# Patient Record
Sex: Male | Born: 1985 | Race: White | Hispanic: No | Marital: Single | Smoking: Never smoker
Health system: Southern US, Community
[De-identification: ages and names within clinical notes are randomized; demographics above are authoritative.]

## PROBLEM LIST (undated history)

## (undated) DIAGNOSIS — T7840XA Allergy, unspecified, initial encounter: Secondary | ICD-10-CM

## (undated) HISTORY — DX: Allergy, unspecified, initial encounter: T78.40XA

---
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
## 2005-10-26 ENCOUNTER — Encounter: Payer: Self-pay | Admitting: Family Medicine

## 2006-07-18 ENCOUNTER — Ambulatory Visit: Payer: Self-pay | Admitting: Family Medicine

## 2006-07-18 DIAGNOSIS — G4700 Insomnia, unspecified: Secondary | ICD-10-CM

## 2006-07-18 DIAGNOSIS — J45901 Unspecified asthma with (acute) exacerbation: Secondary | ICD-10-CM | POA: Insufficient documentation

## 2006-07-21 ENCOUNTER — Encounter: Payer: Self-pay | Admitting: Family Medicine

## 2006-09-02 ENCOUNTER — Telehealth: Payer: Self-pay | Admitting: Family Medicine

## 2006-11-15 ENCOUNTER — Ambulatory Visit: Payer: Self-pay | Admitting: Family Medicine

## 2006-11-15 ENCOUNTER — Encounter: Admission: RE | Admit: 2006-11-15 | Discharge: 2006-11-15 | Payer: Self-pay | Admitting: Family Medicine

## 2006-12-29 ENCOUNTER — Telehealth: Payer: Self-pay | Admitting: Family Medicine

## 2008-06-18 IMAGING — CR DG FOOT 2V*L*
2 series · 2 of 2 positions shown · non-contrast
Comparison: none

CLINICAL DATA: Heel pain.  Hurt playing football.  Swelling and bruising. 
 LEFT FOOT ? 2 VIEW:

[view not recorded (1 of 2)]
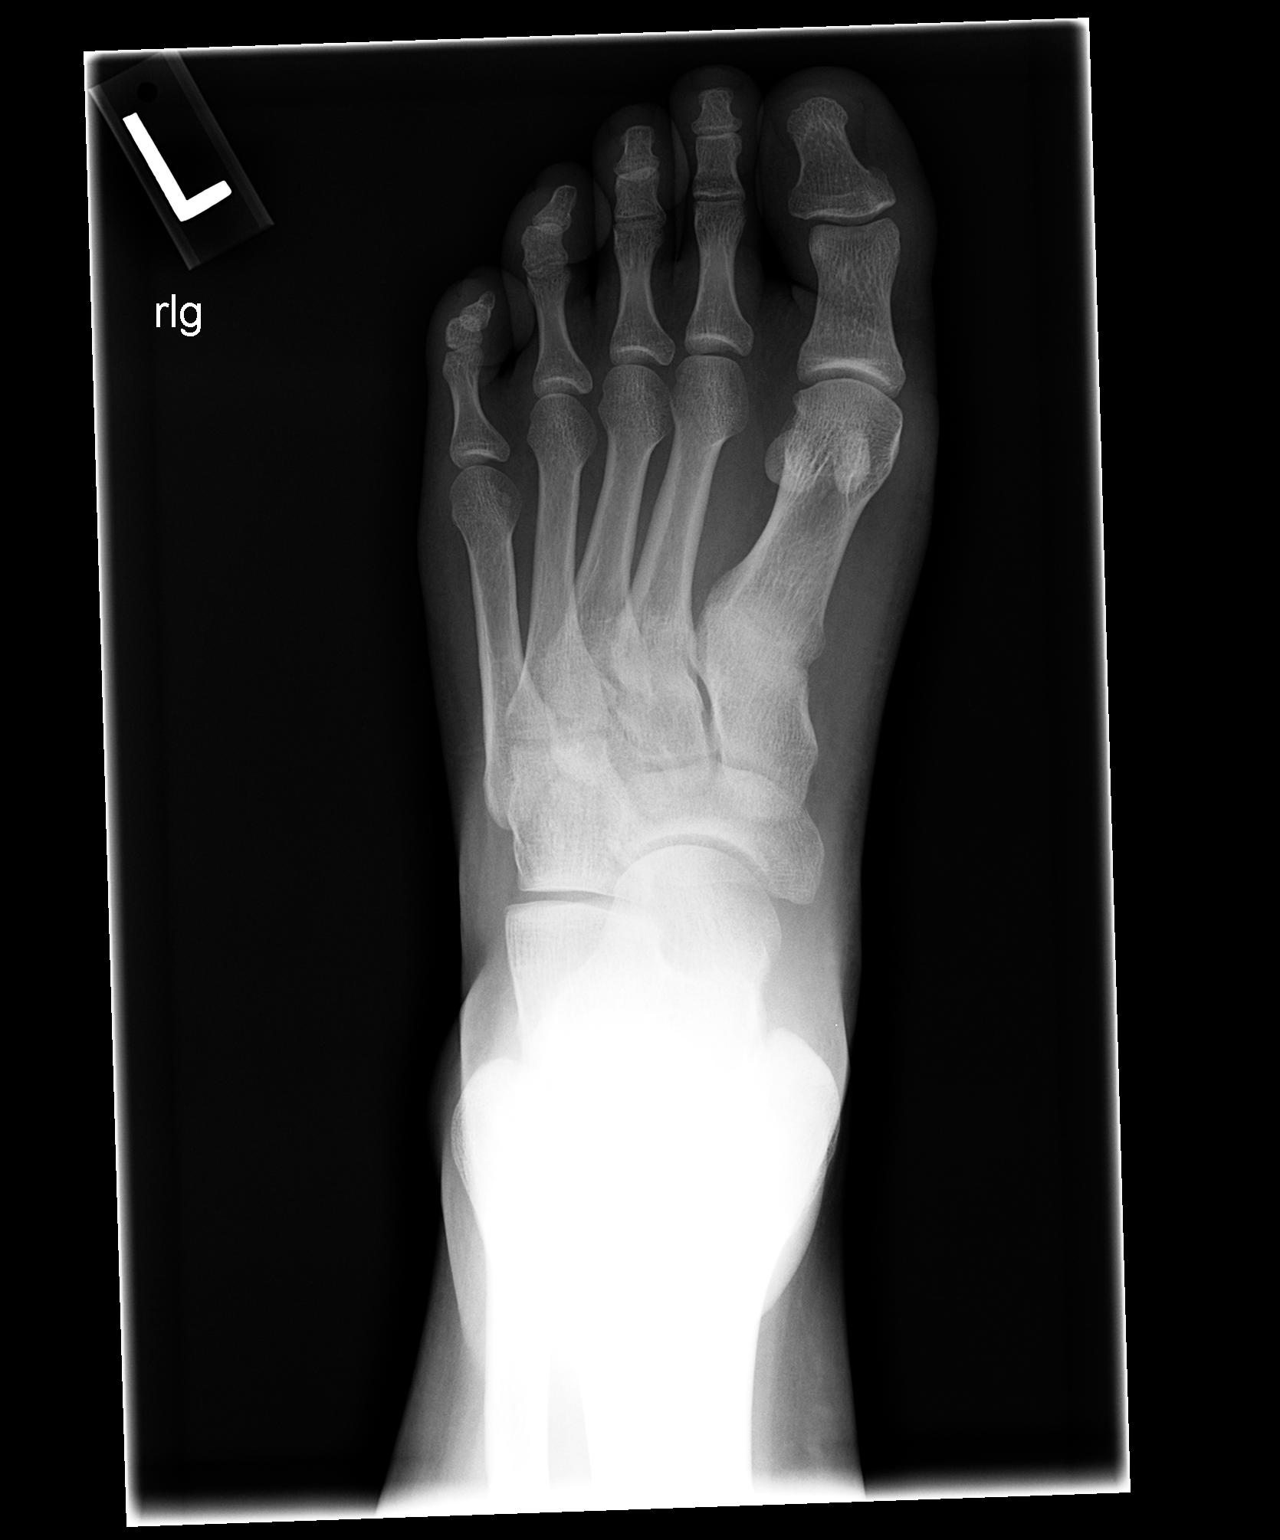

[view not recorded (2 of 2)]
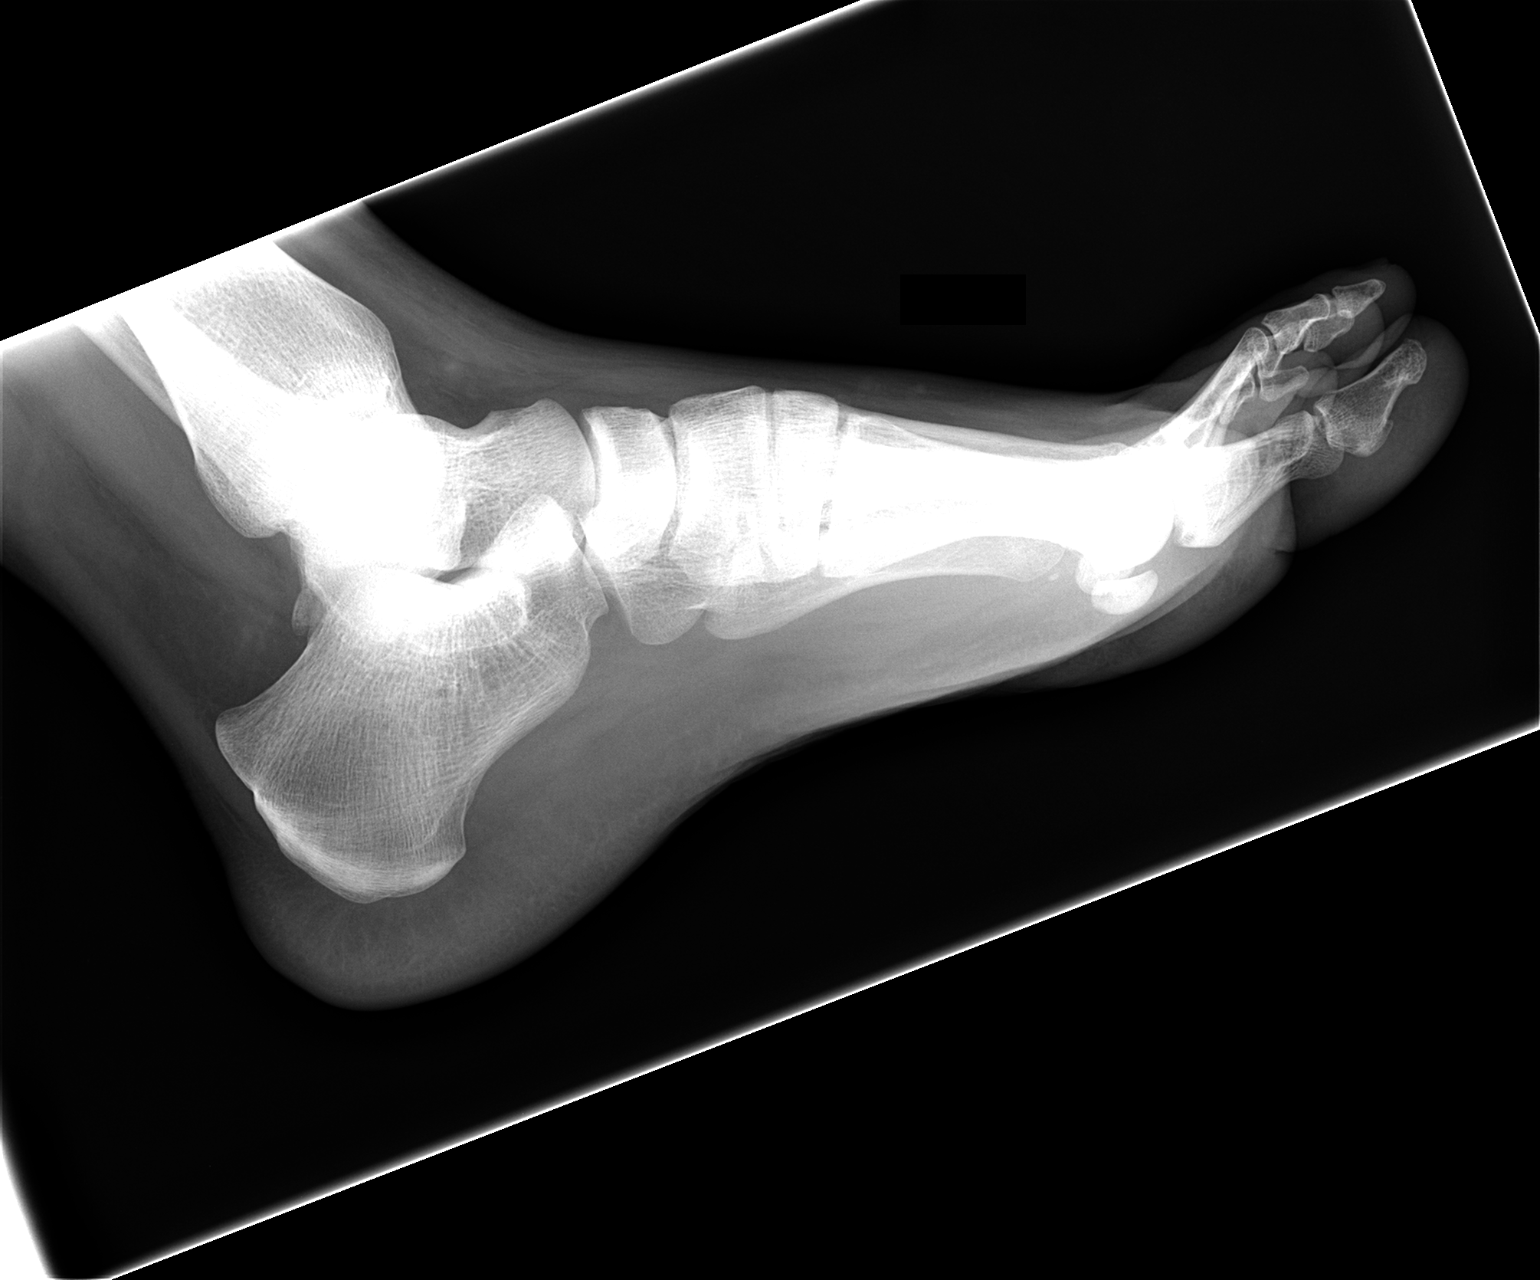

[2 of 2 positions shown; findings below may reference images not displayed]

FINDINGS: Two views of the foot demonstrate no fracture or dislocation.  There are no calcaneal, plantar, or Achilles spurs.  There is no evidence for tarsal coalition or stress fracture.
IMPRESSION: Negative.

## 2008-12-11 ENCOUNTER — Ambulatory Visit: Payer: Self-pay | Admitting: Family Medicine

## 2008-12-11 DIAGNOSIS — F419 Anxiety disorder, unspecified: Secondary | ICD-10-CM

## 2009-01-03 ENCOUNTER — Ambulatory Visit (HOSPITAL_COMMUNITY): Payer: Self-pay | Admitting: Psychiatry

## 2009-02-20 ENCOUNTER — Ambulatory Visit (HOSPITAL_COMMUNITY): Payer: Self-pay | Admitting: Psychiatry

## 2009-06-16 ENCOUNTER — Encounter: Payer: Self-pay | Admitting: Family Medicine

## 2009-07-21 ENCOUNTER — Ambulatory Visit (HOSPITAL_COMMUNITY): Payer: Self-pay | Admitting: Psychiatry

## 2009-09-22 ENCOUNTER — Telehealth: Payer: Self-pay | Admitting: Family Medicine

## 2009-09-22 ENCOUNTER — Encounter: Payer: Self-pay | Admitting: Family Medicine

## 2009-10-07 ENCOUNTER — Telehealth: Payer: Self-pay | Admitting: Family Medicine

## 2009-10-09 ENCOUNTER — Ambulatory Visit: Payer: Self-pay | Admitting: Family Medicine

## 2009-12-10 ENCOUNTER — Encounter: Payer: Self-pay | Admitting: Family Medicine

## 2010-03-18 ENCOUNTER — Ambulatory Visit: Payer: Self-pay | Admitting: Family Medicine

## 2010-03-19 ENCOUNTER — Encounter: Payer: Self-pay | Admitting: Family Medicine

## 2010-03-19 LAB — CONVERTED CEMR LAB
AST: 15 units/L (ref 0–37)
Albumin: 4.9 g/dL (ref 3.5–5.2)
BUN: 11 mg/dL (ref 6–23)
Bilirubin Urine: NEGATIVE
CO2: 28 meq/L (ref 19–32)
Calcium: 9.3 mg/dL (ref 8.4–10.5)
Chloride: 100 meq/L (ref 96–112)
Cholesterol: 151 mg/dL (ref 0–200)
Glucose, Bld: 83 mg/dL (ref 70–99)
HCT: 45.3 % (ref 39.0–52.0)
HDL: 54 mg/dL (ref >39)
Hemoglobin, Urine: NEGATIVE
Hemoglobin: 14.9 g/dL (ref 13.0–17.0)
Leukocytes, UA: NEGATIVE
Potassium: 4.1 meq/L (ref 3.5–5.3)
RBC: 4.92 M/uL (ref 4.22–5.81)
RDW: 13 % (ref 11.5–15.5)
Urine Glucose: NEGATIVE mg/dL
WBC: 9.2 10*3/uL (ref 4.0–10.5)
pH: 7 (ref 5.0–8.0)

## 2010-04-02 ENCOUNTER — Telehealth: Payer: Self-pay | Admitting: Family Medicine

## 2010-04-03 ENCOUNTER — Ambulatory Visit: Payer: Self-pay | Admitting: Family Medicine

## 2010-04-03 DIAGNOSIS — M2140 Flat foot [pes planus] (acquired), unspecified foot: Secondary | ICD-10-CM | POA: Insufficient documentation

## 2010-04-03 DIAGNOSIS — M545 Low back pain: Secondary | ICD-10-CM

## 2010-05-15 ENCOUNTER — Telehealth: Payer: Self-pay | Admitting: Family Medicine

## 2010-06-16 NOTE — Assessment & Plan Note (Signed)
Summary: CPE   Vital Signs:  Patient profile:   25 year old male Height:      69 inches Weight:      202 pounds BMI:     29.94 Pulse rate:   62 / minute BP sitting:   144 / 76  (right arm) Cuff size:   regular  Vitals Entered By: Avon Gully CMA, Duncan Dull) (March 18, 2010 9:49 AM)  Serial Vital Signs/Assessments:  Time      Position  BP       Pulse  Resp  Temp     By 10:33 AM            134/68                         Nani Gasser MD  CC: CPE   Primary Care Hayven Croy:  Linford Arnold, C  CC:  CPE.  History of Present Illness: [place] in [place] doing research. Started with HA, weakness adn by that night full ill.  Dengue fever was dx and taken to the ED.  Lasted about a week.  Not able to eat at allthat week. Had abnormal electroytes and elevated liver enzymes.    Asthma is well contolled. Some wheezing  with heavy exercise. Hasn't used his albuterol since then end of June.   Current Medications (verified): 1)  Singulair 10 Mg Tabs (Montelukast Sodium) .... Take One Tablet By Mouth Once A Day 2)  Proair Hfa 108 (90 Base) Mcg/act Aers (Albuterol Sulfate) .... Use As Needed For Wheezing 3)  Astelin 137 Mcg/spray Soln (Azelastine Hcl) .... Use 2 Sprays Each Nostril Everyday 4)  Prednisone 10 Mg Tabs (Prednisone) .... Use As Directed As Needed Mouth Ulcers 5)  Zoloft 50 Mg Tabs (Sertraline Hcl) .... Take One Tablet By Mouth Once A Day  Allergies (verified): 1)  Pcn  Comments:  Nurse/Medical Assistant: The patient's medications and allergies were reviewed with the patient and were updated in the Medication and Allergy Lists. Avon Gully CMA, Duncan Dull) (March 18, 2010 9:50 AM)  Past History:  Past Medical History: Completed allergy shots.   Family History: Reviewed history from 07/18/2006 and no changes required. Family History of Alcoholism/Addiction-grandfather- maternal Family History of Arthritis-mother,grandmother-maternal Family  Hsitory Breast cancer 1st degree relative <50-great grandmother Family History Depression-mother,grandmothers, aunts, cousins Family Hsitory Headaches- mother, mat. grandmother, aunt Family History Human resources officer. grandfather Family History Hypertension- mat. grandfather, aunt Family History Liver disease- maternal grandfather Family History Osteoporosis- mother Family History of Skin cancer- mat. grandfather Family History Thyroid disease- aunts, grandmother  Social History: Single-Graduated form college.  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   Never Smoked Alcohol use-no Drug use-no Regular exercise-yes  Review of Systems  The patient denies anorexia, fever, weight loss, weight gain, vision loss, decreased hearing, hoarseness, chest pain, syncope, dyspnea on exertion, peripheral edema, prolonged cough, headaches, hemoptysis, abdominal pain, melena, hematochezia, severe indigestion/heartburn, hematuria, incontinence, genital sores, muscle weakness, suspicious skin lesions, transient blindness, difficulty walking, depression, unusual weight change, abnormal bleeding, and enlarged lymph nodes.    Physical Exam  General:  Well-developed,well-nourished,in no acute distress; alert,appropriate and cooperative throughout examination Head:  Normocephalic and atraumatic without obvious abnormalities. No apparent alopecia or balding. Eyes:  No corneal or conjunctival inflammation noted. EOMI. Perrla.  Ears:  External ear exam shows no significant lesions or deformities.  Otoscopic examination reveals clear canals, tympanic membranes are intact bilaterally without  bulging, retraction, inflammation or discharge. Hearing is grossly normal bilaterally. Nose:  External nasal examination shows no deformity or inflammation.  Mouth:  Oral mucosa and oropharynx without lesions or exudates.  Teeth in good repair. Neck:  No deformities, masses, or tenderness noted. Lungs:  Normal  respiratory effort, chest expands symmetrically. Lungs are clear to auscultation, no crackles or wheezes. Heart:  Normal rate and regular rhythm. S1 and S2 normal without gallop, murmur, click, rub or other extra sounds. Abdomen:  Bowel sounds positive,abdomen soft and non-tender without masses, organomegaly or hernias noted. Msk:  No deformity or scoliosis noted of thoracic or lumbar spine.   Pulses:  R and L carotid,radial,dorsalis pedis and posterior tibial pulses are full and equal bilaterally Extremities:  No clubbing, cyanosis, edema, or deformity noted with normal full range of motion of all joints.   Neurologic:  No cranial nerve deficits noted. Station and gait are normal. DTRs are symmetrical throughout. Sensory, motor and coordinative functions appear intact. Skin:  no rashes.   Cervical Nodes:  No lymphadenopathy noted Psych:  Cognition and judgment appear intact. Alert and cooperative with normal attention span and concentration. No apparent delusions, illusions, hallucinations   Impression & Recommendations:  Problem # 1:  EXAMINATION, ROUTINE MEDICAL (ICD-V70.0) Exam is normal today. REpeat BP was much better and normal. Continue regular exercise and healthy diet. Due for labs.  Orders: T-Comprehensive Metabolic Panel [phone]) T-Lipid Profile ([id number])  Problem # 2:  DENGUE (ICD-061) Will repeat labs to make sure the labs have normalized.   Orders: T-CBC No Diff ([id number]) T-Urinalysis ([id number])  Complete Medication List: 1)  Singulair 10 Mg Tabs (Montelukast sodium) .... Take one tablet by mouth once a day 2)  Proair Hfa 108 (90 Base) Mcg/act Aers (Albuterol sulfate) .... Use as needed for wheezing 3)  Astelin 137 Mcg/spray Soln (Azelastine hcl) .... Use 2 sprays each nostril everyday 4)  Prednisone 10 Mg Tabs (Prednisone) .... Use as directed as needed mouth ulcers 5)  Triamcinolone Acetonide 0.1 % Crea (Triamcinolone acetonide) .... Apply two times a  day to affected areas. 6)  Zoloft 50 Mg Tabs (Sertraline hcl) .... Take one tablet by mouth once a day  Contraindications/Deferment of Procedures/Staging:    Test/Procedure: FLU VAX    Reason for deferment: patient declined   Patient Instructions: 1)  We will call you with the lab results.  Prescriptions: TRIAMCINOLONE ACETONIDE 0.1 %  CREA (TRIAMCINOLONE ACETONIDE) Apply two times a day to affected areas.  #1 large tube x 1   Entered and Authorized by:   Nani Gasser MD   Signed by:   Nani Gasser MD on 03/18/2010   Method used:   Electronically to        [facility] [phone]* (retail)       [address] [place], [place]  [address]       Ph: [phone]       Fax: [phone]   RxID:   [id number]    Orders Added: 1)  T-Comprehensive Metabolic Panel [[id number]] 2)  T-CBC No Diff [[id number]] 3)  T-Urinalysis [81003-65000] 4)  T-Lipid Profile [[id number]] 5)  Est. Patient age 21-39 [phone]

## 2010-06-16 NOTE — Letter (Signed)
Summary: Copy of Medical History & Immunization Form from 5-06/[place]  Copy of Medical History & Immunization Form from 5-06/[place]   Imported By: Lanelle Bal 10/03/2009 10:59:28  _____________________________________________________________________  External Attachment:    Type:   Image     Comment:   External Document

## 2010-06-16 NOTE — Assessment & Plan Note (Signed)
Summary: HEP A INJ  Nurse Visit   Allergies: 1)  Pcn  Immunizations Administered:  Hepatitis A Vaccine # 1:    Vaccine Type: HepA    Site: right deltoid    Dose: 0.5 ml    Route: IM    Given by: Payton Spark CMA    Exp. Date: 08/01/2011    Lot #: [id number]    VIS given: 08/04/04 version given Oct 09, 2009.  Orders Added: 1)  Hepatitis A Vaccine (Adult Dose) [90632] 2)  Admin 1st Vaccine [09811]

## 2010-06-16 NOTE — Assessment & Plan Note (Signed)
Summary: ORTHOTICS/NP/LP   Vital Signs:  Patient profile:   25 year old male Height:      69 inches (175.26 cm) Weight:      201.6 pounds (91.64 kg) BMI:     29.88 Temp:     97.5 degrees F (36.39 degrees C) oral Pulse rate:   57 / minute BP sitting:   117 / 73  (right arm)  Vitals Entered By: Baxter Hire) (April 03, 2010 1:58 PM) CC: Orthotics Nutritional Status BMI of 25 - 29 = overweight  Does patient need assistance? Functional Status Self care Ambulation Normal   Primary Care Provider:  Linford Arnold, C  CC:  Orthotics.  History of Present Illness: 25 yo M here for custom orthotics  Patient has had these in the past for problems with his low back (had compression fractures in teen years from snowboarding injury) These orthotics worked well for his back pain but dog has since eaten them Pain intermittent - not much pain now When pain comes on it is in low back, feels tight across whole back and can be constant. No bowel/bladder issues No numbness/tingling or radiation into legs No knee, hip, or ankle pain. Plans on buying new shoes within next 1-2 weeks - currently has 11 1/2 size shoes but usually wears 10 1/2.  Habits & Providers  Alcohol-Tobacco-Diet     Alcohol drinks/day: occassionally     Tobacco Status: never  Problems Prior to Update: 1)  Mood Disorder  (ICD-296.90) 2)  Pain in Limb  (ICD-729.5) 3)  Examination, Routine Medical  (ICD-V70.0) 4)  Asthma, Acute  (ICD-493.92) 5)  Insomnia  (ICD-780.52) 6)  Family History Depression  (ICD-V17.0) 7)  Family Hsitory Breast Cancer 1st Degree Relative <50  (ICD-V16.3) 8)  Family History of Alcoholism/addiction  (ICD-V61.41)  Medications Prior to Update: 1)  Singulair 10 Mg Tabs (Montelukast Sodium) .... Take One Tablet By Mouth Once A Day 2)  Proair Hfa 108 (90 Base) Mcg/act Aers (Albuterol Sulfate) .... Use As Needed For Wheezing 3)  Astelin 137 Mcg/spray Soln (Azelastine Hcl) .... Use 2 Sprays Each  Nostril Everyday 4)  Prednisone 10 Mg Tabs (Prednisone) .... Use As Directed As Needed Mouth Ulcers 5)  Triamcinolone Acetonide 0.1 %  Crea (Triamcinolone Acetonide) .... Apply Two Times A Day To Affected Areas. 6)  Zoloft 50 Mg Tabs (Sertraline Hcl) .... Take One Tablet By Mouth Once A Day  Allergies: 1)  Pcn  Family History: Reviewed history from 07/18/2006 and no changes required. Family History of Alcoholism/Addiction-grandfather- maternal Family History of Arthritis-mother,grandmother-maternal Family Hsitory Breast cancer 1st degree relative <50-great grandmother Family History Depression-mother,grandmothers, aunts, cousins Family Hsitory Headaches- mother, mat. grandmother, aunt Family History Human resources officer. grandfather Family History Hypertension- mat. grandfather, aunt Family History Liver disease- maternal grandfather Family History Osteoporosis- mother Family History of Skin cancer- mat. grandfather Family History Thyroid disease- aunts, grandmother  Social History: Reviewed history from 03/18/2010 and no changes required. Single-Graduated from college.  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   Never Smoked Alcohol use-no Drug use-no Regular exercise-yes  Physical Exam  General:  Well-developed,well-nourished,in no acute distress; alert,appropriate and cooperative throughout examination Msk:  Back: No gross deformity, swelling, or bruising. No midline or paraspinal TTP. FROM. Tight L > R hamstrings Strength 5/5 BLEs Sensation intact to light touch Negative SLRs 1+ MSRs patellar and achilles tendons and equal bilaterally.  Leg lengths equal Moderate long arch collapse No transverse arch collapse or callus over MT heads  No hallux valgus or rigidus With custom orthotics, overpronation controlled in hallway   Impression & Recommendations:  Problem # 1:  LUMBAGO (ICD-724.2)  Discussed working on hamstring flexibility and core strengthening -  both of which will prevent recurrences of back pain.  Made custom orthotics today.  Right one felt very comfortable but felt ridge in left one at border of mid-forefoot despite grinding and trimming - advised him I did not want to make further adjustments because he plans to buy new smaller shoes (will have to adjust these then) and he will wear these in over the next 2 weeks - if still bothering then, will make further adjustments.  F/u in 2 weeks with new shoes for recheck.  Patient was fitted for a : standard, cushioned, semi-rigid orthotic. The orthotic was heated and afterward the patient stood on the orthotic blank positioned on the orthotic stand. The patient was positioned in subtalar neutral position and 10 degrees of ankle dorsiflexion in a weight bearing stance. After completion of molding, a stable base was applied to the orthotic blank. The blank was ground to a stable position for weight bearing. Size: 12 blue fasttek Base: blue med density EVA Posting: none Additional orthotic padding: none Total prep time 45 minutes  Orders: Orthotic Materials, each unit (E4540)  Problem # 2:  PES PLANUS (ICD-734)  Orders: Orthotic Materials, each unit (J8119)  Complete Medication List: 1)  Singulair 10 Mg Tabs (Montelukast sodium) .... Take one tablet by mouth once a day 2)  Proair Hfa 108 (90 Base) Mcg/act Aers (Albuterol sulfate) .... Use as needed for wheezing 3)  Astelin 137 Mcg/spray Soln (Azelastine hcl) .... Use 2 sprays each nostril everyday 4)  Prednisone 10 Mg Tabs (Prednisone) .... Use as directed as needed mouth ulcers 5)  Triamcinolone Acetonide 0.1 % Crea (Triamcinolone acetonide) .... Apply two times a day to affected areas. 6)  Zoloft 50 Mg Tabs (Sertraline hcl) .... Take one tablet by mouth once a day   Orders Added: 1)  New Patient Level III [14782] 2)  Orthotic Materials, each unit [L3002]

## 2010-06-16 NOTE — Progress Notes (Signed)
Summary: Singulair rx  Phone Note Call from Patient Call back at Home Phone [phone]   Caller: Patient Call For: [name] Summary of Call: Can Kyle Castro get a rx for the Singulair #30 with 5 refills to take with him to [facility] so he can get these filled while he is away. Initial call taken by: Kathlene November,  Oct 07, 2009 1:56 PM    Prescriptions: SINGULAIR 10 MG TABS (MONTELUKAST SODIUM) take one tablet by mouth once a day  #30 x 5   Entered and Authorized by:   Seymour Bars DO   Signed by:   Seymour Bars DO on 10/07/2009   Method used:   Print then Give to Patient   RxID:   [id number]

## 2010-06-16 NOTE — Miscellaneous (Signed)
Summary: Vaccine Record  Vaccine Record   Imported By: Lanelle Bal 10/03/2009 11:01:45  _____________________________________________________________________  External Attachment:    Type:   Image     Comment:   External Document

## 2010-06-16 NOTE — Progress Notes (Signed)
Summary: Meds and allergies  Phone Note Call from Patient Call back at Home Phone [phone]   Caller: Mom Call For: Nani Gasser MD Summary of Call: Pt going to [facility] [place] and needs a refill on the ProAir inhaler sent to [facility]. I guess needs the antibiotic and diarrhea med as well to take with him. Getting his vaccine at [facility] on campus. Allergies flaring pretty bad taking the Singulair but wanted to know if there was any nasal spray that would help- real stopped up Initial call taken by: Kathlene November,  Sep 22, 2009 10:45 AM  Follow-up for Phone Call        Rx sent for proair and vermayst for nasal spray ( looks like same tier as flonase).  Sent over cipro for travelers diarrhea to take if needed.  Can use Immodium (OTC if needed).   Follow-up by: Nani Gasser MD,  Sep 22, 2009 11:58 AM    New/Updated Medications: VERAMYST 27.5 MCG/SPRAY SUSP (FLUTICASONE FUROATE) 2 sprays in each nostril once daily CIPROFLOXACIN HCL 500 MG TABS (CIPROFLOXACIN HCL) Take 1 tablet by mouth two times a day for 3 days for travelers diarrhea Prescriptions: CIPROFLOXACIN HCL 500 MG TABS (CIPROFLOXACIN HCL) Take 1 tablet by mouth two times a day for 3 days for travelers diarrhea  #6 x 0   Entered and Authorized by:   Nani Gasser MD   Signed by:   Nani Gasser MD on 09/22/2009   Method used:   Electronically to        [facility] [phone]* (retail)       [address] [place], [place]  [address]       Ph: [phone]       Fax: [phone]   RxID:   [id number] VERAMYST 27.5 MCG/SPRAY SUSP (FLUTICASONE FUROATE) 2 sprays in each nostril once daily  #1 bottle x 3   Entered and Authorized by:   Nani Gasser MD   Signed by:   Nani Gasser MD on 09/22/2009   Method used:   Electronically to        [facility] [phone]* (retail)       [address] [place], [place]  [address]       Ph: [phone]       Fax: [phone]   RxID:    [id number] PROAIR HFA 108 (90 BASE) MCG/ACT AERS (ALBUTEROL SULFATE) use as needed for wheezing  #2 x 2   Entered and Authorized by:   Nani Gasser MD   Signed by:   Nani Gasser MD on 09/22/2009   Method used:   Electronically to        [facility] [phone]* (retail)       [address] [place], [place]  [address]       Ph: [phone]       Fax: [phone]   RxID:   [id number]

## 2010-06-16 NOTE — Miscellaneous (Signed)
  Clinical Lists Changes  Medications: Added new medication of ZOLOFT 50 MG TABS (SERTRALINE HCL) take one tablet by mouth once a day - Signed Rx of ZOLOFT 50 MG TABS (SERTRALINE HCL) take one tablet by mouth once a day;  #30 x 0;  Signed;  Entered by: Kathlene November;  Authorized by: Nani Gasser MD;  Method used: Print then Give to Patient    Prescriptions: ZOLOFT 50 MG TABS (SERTRALINE HCL) take one tablet by mouth once a day  #30 x 0   Entered by:   Kathlene November   Authorized by:   Nani Gasser MD   Signed by:   Kathlene November on 06/16/2009   Method used:   Print then Give to Patient   RxID:   [id number]

## 2010-06-16 NOTE — Progress Notes (Signed)
Summary: Podiatrist for inserts  Phone Note Call from Patient Call back at Home Phone [phone]   Caller: Patient Call For: Nani Gasser MD Summary of Call: Would like to get a referral to podiatrist to get inserts for shoes. Our dog chewed up his last ones and he c/o back and legs hurting at times since not having them Initial call taken by: Kathlene November LPN,  April 02, 2010 8:37 AM  Follow-up for Phone Call        I would recommend having a sports med doc do them.  If he is OK with that then will recommend [place] Bl[place].   Follow-up by: Nani Gasser MD,  April 02, 2010 9:25 AM  Additional Follow-up for Phone Call Additional follow up Details #1::        called and notified pt. he would like to see  sports med doc Additional Follow-up by: Avon Gully CMA, Duncan Dull),  April 02, 2010 10:32 AM

## 2010-06-18 NOTE — Progress Notes (Signed)
Summary: Broken tooth  Phone Note Call from Patient   Caller: Mom Summary of Call: Has a broken tooth and trying to get in with dentist. Taking lots of IBU. Would like something for pain.  Hydocodone keeps him awake.  Initial call taken by: Nani Gasser MD,  May 15, 2010 12:44 PM  Follow-up for Phone Call        [facility] will send rx.  Make sure not taking more than 800mg  IBU three times a day with food and water. If we have PPI samples can take this once a day for a week or two until get in with the dentis. He has appt scheduled.  Follow-up by: Nani Gasser MD,  May 15, 2010 12:48 PM    New/Updated Medications: OXYCODONE-ACETAMINOPHEN 5-500 MG CAPS (OXYCODONE-ACETAMINOPHEN) 1-2 tabs by mouth every 4-6 hours as needed severe pain. Prescriptions: OXYCODONE-ACETAMINOPHEN 5-500 MG CAPS (OXYCODONE-ACETAMINOPHEN) 1-2 tabs by mouth every 4-6 hours as needed severe pain.  #30 x 0   Entered and Authorized by:   Nani Gasser MD   Signed by:   Nani Gasser MD on 05/15/2010   Method used:   Print then Give to Patient   RxID:   [id number]

## 2010-10-09 ENCOUNTER — Other Ambulatory Visit: Payer: Self-pay | Admitting: *Deleted

## 2010-10-09 MED ORDER — SERTRALINE HCL 50 MG PO TABS: 50.0000 mg | ORAL_TABLET | Freq: Every day | ORAL | Status: DC

## 2010-12-15 ENCOUNTER — Encounter: Payer: Self-pay | Admitting: Family Medicine

## 2011-02-02 ENCOUNTER — Other Ambulatory Visit: Payer: Self-pay | Admitting: *Deleted

## 2011-02-02 MED ORDER — SERTRALINE HCL 50 MG PO TABS: 50.0000 mg | ORAL_TABLET | Freq: Every day | ORAL | Status: DC

## 2011-02-02 NOTE — Telephone Encounter (Signed)
Needs refills

## 2011-03-12 ENCOUNTER — Other Ambulatory Visit: Payer: Self-pay | Admitting: *Deleted

## 2011-03-12 MED ORDER — SERTRALINE HCL 50 MG PO TABS: 50.0000 mg | ORAL_TABLET | Freq: Every day | ORAL | Status: DC

## 2011-08-25 ENCOUNTER — Other Ambulatory Visit: Payer: Self-pay | Admitting: Family Medicine

## 2011-08-25 MED ORDER — SERTRALINE HCL 50 MG PO TABS: 50.0000 mg | ORAL_TABLET | Freq: Every day | ORAL | Status: DC

## 2011-09-16 ENCOUNTER — Ambulatory Visit (INDEPENDENT_AMBULATORY_CARE_PROVIDER_SITE_OTHER): Payer: 59 | Admitting: Family Medicine

## 2011-09-16 ENCOUNTER — Encounter: Payer: Self-pay | Admitting: Family Medicine

## 2011-09-16 VITALS — BP 120/69 | HR 65 | Ht 69.0 in | Wt 195.0 lb

## 2011-09-16 DIAGNOSIS — J45909 Unspecified asthma, uncomplicated: Secondary | ICD-10-CM

## 2011-09-16 DIAGNOSIS — K1370 Unspecified lesions of oral mucosa: Secondary | ICD-10-CM

## 2011-09-16 DIAGNOSIS — K121 Other forms of stomatitis: Secondary | ICD-10-CM

## 2011-09-16 DIAGNOSIS — F419 Anxiety disorder, unspecified: Secondary | ICD-10-CM

## 2011-09-16 DIAGNOSIS — F411 Generalized anxiety disorder: Secondary | ICD-10-CM

## 2011-09-16 MED ORDER — MONTELUKAST SODIUM 10 MG PO TABS: 10.0000 mg | ORAL_TABLET | Freq: Every day | ORAL | Status: DC

## 2011-09-16 NOTE — Progress Notes (Signed)
  Subjective:    Patient ID: Kyle Castro, male    DOB: Sep 16, 1985, 26 y.o.   MRN: [id number]  HPI Anxiety - ON sertraline.  Says feels too flat on the medication. He is taking it consistantly. 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  Graduates.  Says doesn' get happy or angry.    Asthma -  Stil on singular.S ays allergies are well controlled. Using rescue inhaler about 10 times in the last year.  Cold occ triggers it.  No SOB, wheezing or coughing at night. Still good, not expired.     Mouth sores - occ uses prednisone rarely.    Review of Systems     Objective:   Physical Exam  Constitutional: He is oriented to person, place, and time. He appears well-developed and well-nourished.  HENT:  Head: Normocephalic and atraumatic.  Cardiovascular: Normal rate, regular rhythm and normal heart sounds.   Pulmonary/Chest: Effort normal and breath sounds normal.  Neurological: He is alert and oriented to person, place, and time.  Skin: Skin is warm and dry.  Psychiatric: He has a normal mood and affect. His behavior is normal.          Assessment & Plan:  Anxiety- discussed options of changing med since feels too flat vs waning medication. Will wean per patient preference.  I think he will do well. Call if decides wants to restart a different medication.   Asthma - Well controlled. Rarely uses albuterol inhaler. Did refill his singulair. AR under control as well.    Mouth ulcers - under control right now.

## 2011-09-16 NOTE — Patient Instructions (Signed)
Sertraline take 1/2 tab daily for 2 weeks, then 1/2 tab every other day for 2 weeks, then stop.

## 2012-07-11 ENCOUNTER — Encounter: Payer: Self-pay | Admitting: Family Medicine

## 2012-07-11 ENCOUNTER — Ambulatory Visit (INDEPENDENT_AMBULATORY_CARE_PROVIDER_SITE_OTHER): Payer: 59 | Admitting: Family Medicine

## 2012-07-11 VITALS — BP 128/75 | HR 43 | Wt 199.0 lb

## 2012-07-11 MED ORDER — BECLOMETHASONE DIPROPIONATE 40 MCG/ACT IN AERS: 1.0000 | INHALATION_SPRAY | Freq: Two times a day (BID) | RESPIRATORY_TRACT | Status: DC

## 2012-07-11 NOTE — Patient Instructions (Addendum)
Try the Qvar for 2 weeks, if still wheezing then please let me know and we can change you so Advair or Symbicort.

## 2012-07-11 NOTE — Progress Notes (Signed)
  Subjective:    Patient ID: Kyle Castro, male    DOB: Apr 27, 1986, 27 y.o.   MRN: [id number]  HPI Recently started training for a marathon about a month ago.  Not really taking his singulair. Occ uses albuterol prn before exercise.  Not on a controller. Says will wheeze for several hours after running. No nightime sxs. No AR sxs right now but has hx of allergies.     Review of Systems     Objective:   Physical Exam  Constitutional: He is oriented to person, place, and time. He appears well-developed and well-nourished.  HENT:  Head: Normocephalic and atraumatic.  Right Ear: External ear normal.  Left Ear: External ear normal.  Nose: Nose normal.  Mouth/Throat: Oropharynx is clear and moist.  TMs and canals are clear.   Eyes: Conjunctivae and EOM are normal. Pupils are equal, round, and reactive to light.  Neck: Neck supple. No thyromegaly present.  Cardiovascular: Normal rate and normal heart sounds.   Pulmonary/Chest: Effort normal and breath sounds normal.  Lymphadenopathy:    He has no cervical adenopathy.  Neurological: He is alert and oriented to person, place, and time.  Skin: Skin is warm and dry.  Psychiatric: He has a normal mood and affect.          Assessment & Plan:  Asthma - moderate intermittant persistant.  Will start Qvar 1-2 puffs BID.  Try for 2 weeks. If not improving then call and we can change to combo drug.make sure preteating with albuterol before exercise.

## 2013-03-22 ENCOUNTER — Other Ambulatory Visit: Payer: Self-pay

## 2013-09-17 ENCOUNTER — Ambulatory Visit (INDEPENDENT_AMBULATORY_CARE_PROVIDER_SITE_OTHER): Payer: PRIVATE HEALTH INSURANCE | Admitting: Family Medicine

## 2013-09-17 ENCOUNTER — Encounter: Payer: Self-pay | Admitting: Family Medicine

## 2013-09-17 VITALS — BP 129/82 | HR 61 | Ht 69.0 in | Wt 197.0 lb

## 2013-09-17 DIAGNOSIS — S39012A Strain of muscle, fascia and tendon of lower back, initial encounter: Secondary | ICD-10-CM

## 2013-09-17 DIAGNOSIS — J4520 Mild intermittent asthma, uncomplicated: Secondary | ICD-10-CM

## 2013-09-17 DIAGNOSIS — K1370 Unspecified lesions of oral mucosa: Secondary | ICD-10-CM

## 2013-09-17 DIAGNOSIS — J45909 Unspecified asthma, uncomplicated: Secondary | ICD-10-CM

## 2013-09-17 DIAGNOSIS — M545 Low back pain, unspecified: Secondary | ICD-10-CM

## 2013-09-17 DIAGNOSIS — K121 Other forms of stomatitis: Secondary | ICD-10-CM

## 2013-09-17 DIAGNOSIS — J309 Allergic rhinitis, unspecified: Secondary | ICD-10-CM

## 2013-09-17 DIAGNOSIS — G4700 Insomnia, unspecified: Secondary | ICD-10-CM

## 2013-09-17 MED ORDER — TRAZODONE HCL 50 MG PO TABS: 25.0000 mg | ORAL_TABLET | Freq: Every evening | ORAL | Status: DC | PRN

## 2013-09-17 MED ORDER — CYCLOBENZAPRINE HCL 10 MG PO TABS: 10.0000 mg | ORAL_TABLET | Freq: Every evening | ORAL | Status: DC | PRN

## 2013-09-17 MED ORDER — MONTELUKAST SODIUM 10 MG PO TABS: 10.0000 mg | ORAL_TABLET | Freq: Every day | ORAL | Status: DC

## 2013-09-17 NOTE — Progress Notes (Signed)
   Subjective:    Patient ID: Kyle Castro, male    DOB: [birth year], 28 y.o.   MRN: [id number]  HPI  F/U asthma and allergies.  Says his asthma has been ok. Out of his singulair. Uses zyrtec as needed.  Not using the Qvar.  Using albutreol once a month for rescue. No nighttime symptoms. No recent hospitalizations urgent care visits for this..    Hasn't been sleeping well x 1 year. His anxiety has been up for the last 5 months. Tosses and turn.  Occ has some back pain that keeps him from falling asleep at times. Says benadryl is somewhat sedating adn helps but doesn't last long enough.    Has been having some low back, bilateral on and off x 5 months.  Feels really tight at times and has been keeping him up at night.   but hasn't been running or stretching like last summer. Has been more sedentary over the winter..   Mouth ulcers starting as a child. Has never been evaluated for them. Mom has similar sxs.  Trauma can trigger it.  Can have 7-8 lesions at a time.  No lesions on the genital area or in the eyes. Doesn't usually do any type of home remedies or treatment. He just lets it run its course. They usually resolve within one to 2 weeks.  Review of Systems     Objective:   Physical Exam  Constitutional: He is oriented to person, place, and time. He appears well-developed and well-nourished.  HENT:  Head: Normocephalic and atraumatic.  Cardiovascular: Normal rate, regular rhythm and normal heart sounds.   Pulmonary/Chest: Effort normal and breath sounds normal.  Musculoskeletal:  Lumbar spine with normal flexion, extension, rotation right and left, side bending. Nontender over the lumbar spine or paraspinous muscles. Nontender over the SI joints. Hip, knee, ankle strength is 5 out of 5 bilaterally. Negative straight leg raise bilaterally. Patellar reflexes 1+ bilaterally.  Neurological: He is alert and oriented to person, place, and time.  Skin: Skin is warm and dry.  Psychiatric: He  has a normal mood and affect. His behavior is normal.          Assessment & Plan:  Asthma - intermittent. well controlled off Qvar. Continue to use albuterol as needed. If he is noticing increasing frequency of use please let us know and we can always restart the Qvar. Also maximize control of allergic rhinitis in spring allergies have started.  AR - will refill Singulair. Can take with the Zyrtec if needed for maximal control of symptoms. He typically does get spring allergies.  Insomnia- we discussed the importance of getting a sleep wake cycle it is consistent. I think is very disruptive to his sleep pattern. We'll also start a trial of trazodone. Start with half a tab and increase to 1 tab if needed. Can increase to 2 tabs if needed before see him back. Handout provided with additional information.  Low back pain - exam is consistent with lumbar strain. He thinks it may have been injured initially years ago when he had a skiing accident and actually fractured several thoracic vertebrae. Handout provided on home stretches. Recommend anti-inflammatories as needed. And given a prescription for muscle relaxer to use as needed. Warned about potential sedation and to use only in the evening or at bedtime.  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 We'll do some additional blood work. We'll call the results once available.

## 2013-09-17 NOTE — Patient Instructions (Signed)

## 2013-10-30 ENCOUNTER — Other Ambulatory Visit: Payer: Self-pay | Admitting: *Deleted

## 2013-10-30 MED ORDER — ALBUTEROL SULFATE HFA 108 (90 BASE) MCG/ACT IN AERS: 2.0000 | INHALATION_SPRAY | Freq: Four times a day (QID) | RESPIRATORY_TRACT | Status: DC | PRN

## 2014-06-26 ENCOUNTER — Other Ambulatory Visit: Payer: Self-pay | Admitting: *Deleted

## 2014-06-26 ENCOUNTER — Other Ambulatory Visit: Payer: Self-pay | Admitting: Family Medicine

## 2014-06-26 DIAGNOSIS — K121 Other forms of stomatitis: Secondary | ICD-10-CM

## 2014-06-27 LAB — CBC
HCT: 43.3 % (ref 39.0–52.0)
Hemoglobin: 15.2 g/dL (ref 13.0–17.0)
MCH: 30.2 pg (ref 26.0–34.0)
MCHC: 35.1 g/dL (ref 30.0–36.0)
MCV: 85.9 fL (ref 78.0–100.0)
MPV: 10 fL (ref 8.6–12.4)
PLATELETS: 215 10*3/uL (ref 150–400)
RBC: 5.04 MIL/uL (ref 4.22–5.81)
RDW: 13.3 % (ref 11.5–15.5)
WBC: 5.2 10*3/uL (ref 4.0–10.5)

## 2014-06-27 LAB — SEDIMENTATION RATE: SED RATE: 1 mm/h (ref 0–15)

## 2014-06-27 LAB — RHEUMATOID FACTOR

## 2014-06-27 LAB — ANA: Anti Nuclear Antibody(ANA): NEGATIVE

## 2014-06-27 LAB — C-REACTIVE PROTEIN

## 2014-06-27 LAB — HLA B*5701

## 2014-07-01 LAB — HLA-B27 ANTIGEN: DNA Result:: NOT DETECTED

## 2014-07-03 LAB — HLA A AND B PHENOTYPE

## 2014-07-05 ENCOUNTER — Other Ambulatory Visit: Payer: Self-pay | Admitting: *Deleted

## 2014-07-05 DIAGNOSIS — K121 Other forms of stomatitis: Secondary | ICD-10-CM

## 2014-07-05 NOTE — Addendum Note (Signed)
Addended by: Nani GasserMETHENEY, Jenet Durio D on: 07/05/2014 05:24 PM   Modules accepted: Orders

## 2014-07-08 ENCOUNTER — Telehealth: Payer: Self-pay | Admitting: *Deleted

## 2014-07-08 NOTE — Telephone Encounter (Signed)
[phone] (Home) [phone] (Mobile)  Left VM that referral has been done.

## 2014-07-08 NOTE — Telephone Encounter (Signed)
-----   Message from Agapito Gamesatherine D Metheney, MD sent at 07/05/2014  5:24 PM EST ----- Referral placed.

## 2014-08-19 ENCOUNTER — Encounter: Payer: Self-pay | Admitting: Family Medicine

## 2014-08-19 ENCOUNTER — Ambulatory Visit (INDEPENDENT_AMBULATORY_CARE_PROVIDER_SITE_OTHER): Payer: PRIVATE HEALTH INSURANCE | Admitting: Family Medicine

## 2014-08-19 VITALS — BP 135/78 | HR 54 | Temp 97.9°F | Wt 200.0 lb

## 2014-08-19 DIAGNOSIS — L0591 Pilonidal cyst without abscess: Secondary | ICD-10-CM | POA: Diagnosis not present

## 2014-08-19 DIAGNOSIS — J4520 Mild intermittent asthma, uncomplicated: Secondary | ICD-10-CM | POA: Diagnosis not present

## 2014-08-19 MED ORDER — ALBUTEROL SULFATE HFA 108 (90 BASE) MCG/ACT IN AERS: 2.0000 | INHALATION_SPRAY | Freq: Four times a day (QID) | RESPIRATORY_TRACT | Status: AC | PRN

## 2014-08-19 NOTE — Progress Notes (Signed)
CC: Kyle Castro is a 29 y.o. male is here for Cyst   Subjective: HPI:  Friday last week patient was found to have a pilonidal cyst. He tells me for 4-5 days prior to this diagnosis he was having pain at the top of his gluteal cleft that was worse with sitting down. On Thursday he began to get body aches and was told by an urgent care center that he had a pilonidal cyst and needed to go to the emergency room. This cyst was incised and drained in the emergency room and he had a painless rectal exam, there is a low suspicion for a fistula or tracking of the cyst more than a few centimeters. He originally had it packed however it fell out when he returned home on Saturday. He's been taking clindamycin 300 mg 4 times a day and hydrocodone at nighttime to help with sleep. He's been doing a sitz bath twice a day and keeping gauze over the wound. It used to be bleeding but now it's only providing a straw-colored discharge. He tells me overall he feels much better still with tenderness at the incision site. There've been no fever since Saturday. There's been no nausea, confusion, flushing, constipation or diarrhea. No pain with defecation   Review Of Systems Outlined In HPI  Past Medical History  Diagnosis Date  . Allergy     No past surgical history on file. Family History  Problem Relation Age of Onset  . Arthritis Mother   . Migraines Mother   . Osteoporosis Mother   . Heart disease Father   . Hyperlipidemia Father   . Hypertension Father   . Thyroid disease Maternal Aunt   . Depression Maternal Aunt   . Migraines Maternal Aunt   . Hypertension Maternal Aunt   . Arthritis Maternal Grandmother   . Depression Maternal Grandmother   . Alcohol abuse Maternal Grandfather   . Heart disease Maternal Grandfather   . Hyperlipidemia Maternal Grandfather   . Hypertension Maternal Grandfather   . Neuropathy Maternal Grandfather   . Skin cancer Maternal Grandfather   . Liver disease Maternal  Grandfather   . Cancer Other 50    breast cancer    History   Social History  . Marital Status: Single    Spouse Name: N/A  . Number of Children: N/A  . Years of Education: N/A   Occupational History  . Not on file.   Social History Main Topics  . Smoking status: Never Smoker   . Smokeless tobacco: Not on file  . Alcohol Use: No  . Drug Use: No  . Sexual Activity: No   Other Topics Concern  . Not on file   Social History Narrative     Objective: BP 135/78 mmHg  Pulse 54  Temp(Src) 97.9 F (36.6 C) (Oral)  Wt 200 lb (90.719 kg)  Vital signs reviewed. General: Alert and Oriented, No Acute Distress HEENT: Pupils equal, round, reactive to light. Conjunctivae clear.  External ears unremarkable.  Moist mucous membranes. Lungs: Clear and comfortable work of breathing, speaking in full sentences without accessory muscle use. Cardiac: Regular rate and rhythm.  Neuro: CN II-XII grossly intact, gait normal. Extremities: No peripheral edema.  Strong peripheral pulses.  Mental Status: No depression, anxiety, nor agitation. Logical though process. Skin: Warm and dry. At the top of the gluteal cleft there is a horizontal 1 cm incision with a clear/straw-colored discharge but no pus or tenderness when probing with a cotton tipped probe.  Assessment & Plan: Kyle Castro was seen today for cyst.  Diagnoses and all orders for this visit:  Pilonidal cyst Orders: -     Cancel: WBC -     CBC  Asthma, chronic, mild intermittent, uncomplicated Orders: -     albuterol (PROAIR HFA) 108 (90 BASE) MCG/ACT inhaler; Inhale 2 puffs into the lungs every 6 (six) hours as needed.   Pilonidal cyst: Improving clinically, will get a white blood cell count today to confirm that he is on the road to recovery. In the meantime continue clindamycin for a full 7 days, as needed hydrocodone andtwice a day sitz bath until out of clindamycin.Signs and symptoms requring emergent/urgent reevaluation were  discussed with the patient.   Return if symptoms worsen or fail to improve.

## 2014-08-20 LAB — CBC
HEMATOCRIT: 42.9 % (ref 39.0–52.0)
Hemoglobin: 15 g/dL (ref 13.0–17.0)
MCH: 29.9 pg (ref 26.0–34.0)
MCHC: 35 g/dL (ref 30.0–36.0)
MCV: 85.5 fL (ref 78.0–100.0)
MPV: 10 fL (ref 8.6–12.4)
Platelets: 269 10*3/uL (ref 150–400)
RBC: 5.02 MIL/uL (ref 4.22–5.81)
RDW: 12.8 % (ref 11.5–15.5)
WBC: 4.3 10*3/uL (ref 4.0–10.5)

## 2017-03-21 ENCOUNTER — Ambulatory Visit: Payer: PRIVATE HEALTH INSURANCE | Admitting: Family Medicine

## 2017-03-21 VITALS — BP 130/72 | HR 66 | Temp 98.6°F | Wt 219.0 lb

## 2017-03-21 DIAGNOSIS — L723 Sebaceous cyst: Secondary | ICD-10-CM

## 2017-03-21 DIAGNOSIS — L089 Local infection of the skin and subcutaneous tissue, unspecified: Secondary | ICD-10-CM | POA: Diagnosis not present

## 2017-03-21 NOTE — Patient Instructions (Signed)
Thank you for coming in today. Recheck as needed.    Skin Abscess A skin abscess is an infected area on or under your skin that contains a collection of pus and other material. An abscess may also be called a furuncle, carbuncle, or boil. An abscess can occur in or on almost any part of your body. Some abscesses break open (rupture) on their own. Most continue to get worse unless they are treated. The infection can spread deeper into the body and eventually into your blood, which can make you feel ill. Treatment usually involves draining the abscess. What are the causes? An abscess occurs when germs, often bacteria, pass through your skin and cause an infection. This may be caused by:  A scrape or cut on your skin.  A puncture wound through your skin, including a needle injection.  Blocked oil or sweat glands.  Blocked and infected hair follicles.  A cyst that forms beneath your skin (sebaceous cyst) and becomes infected.  What increases the risk? This condition is more likely to develop in people who:  Have a weak body defense system (immune system).  Have diabetes.  Have dry and irritated skin.  Get frequent injections or use illegal IV drugs.  Have a foreign body in a wound, such as a splinter.  Have problems with their lymph system or veins.  What are the signs or symptoms? An abscess may start as a painful, firm bump under the skin. Over time, the abscess may get larger or become softer. Pus may appear at the top of the abscess, causing pressure and pain. It may eventually break through the skin and drain. Other symptoms include:  Redness.  Warmth.  Swelling.  Tenderness.  A sore on the skin.  How is this diagnosed? This condition is diagnosed based on your medical history and a physical exam. A sample of pus may be taken from the abscess to find out what is causing the infection and what antibiotics can be used to treat it. You also may have:  Blood tests to  look for signs of infection or spread of an infection to your blood.  Imaging studies such as ultrasound, CT scan, or MRI if the abscess is deep.  How is this treated? Small abscesses that drain on their own may not need treatment. Treatment for an abscess that does not rupture on its own may include:  Warm compresses applied to the area several times per day.  Incision and drainage. Your health care provider will make an incision to open the abscess and will remove pus and any foreign body or dead tissue. The incision area may be packed with gauze to keep it open for a few days while it heals.  Antibiotic medicines to treat infection. For a severe abscess, you may first get antibiotics through an IV and then change to oral antibiotics.  Follow these instructions at home: Abscess Care  If you have an abscess that has not drained, place a warm, clean, wet washcloth over the abscess several times a day. Do this as told by your health care provider.  Follow instructions from your health care provider about how to take care of your abscess. Make sure you: ? Cover the abscess with a bandage (dressing). ? Change your dressing or gauze as told by your health care provider. ? Wash your hands with soap and water before you change the dressing or gauze. If soap and water are not available, use hand sanitizer.  Check your  abscess every day for signs of a worsening infection. Check for: ? More redness, swelling, or pain. ? More fluid or blood. ? Warmth. ? More pus or a bad smell. Medicines  Take over-the-counter and prescription medicines only as told by your health care provider.  If you were prescribed an antibiotic medicine, take it as told by your health care provider. Do not stop taking the antibiotic even if you start to feel better. General instructions  To avoid spreading the infection: ? Do not share personal care items, towels, or hot tubs with others. ? Avoid making skin contact  with other people.  Keep all follow-up visits as told by your health care provider. This is important. Contact a health care provider if:  You have more redness, swelling, or pain around your abscess.  You have more fluid or blood coming from your abscess.  Your abscess feels warm to the touch.  You have more pus or a bad smell coming from your abscess.  You have a fever.  You have muscle aches.  You have chills or a general ill feeling. Get help right away if:  You have severe pain.  You see red streaks on your skin spreading away from the abscess. This information is not intended to replace advice given to you by your health care provider. Make sure you discuss any questions you have with your health care provider. Document Released: 02/10/2005 Document Revised: 12/28/2015 Document Reviewed: 03/12/2015 Elsevier Interactive Patient Education  [employer]2018 [employer]

## 2017-03-21 NOTE — Progress Notes (Signed)
       Remus Lofflerhillip M Ceballos is a 31 y.o. male [facility] today for abscess. Patient notes a painful bump on his thoracic back. He's had a nonpainful pea-sized mass on his back for about 2 years. About 5 days ago became bigger more red and more tender. He has not tried any treatment yet. He denies any fevers or chills nausea vomiting or diarrhea.   Past Medical History:  Diagnosis Date  . Allergy    No past surgical history on file. Social History   Tobacco Use  . Smoking status: Never Smoker  Substance Use Topics  . Alcohol use: No   family history includes Alcohol abuse in his maternal grandfather; Arthritis in his maternal grandmother and mother; Cancer (age of onset: 5950) in his other; Depression in his maternal aunt and maternal grandmother; Heart disease in his father and maternal grandfather; Hyperlipidemia in his father and maternal grandfather; Hypertension in his father, maternal aunt, and maternal grandfather; Liver disease in his maternal grandfather; Migraines in his maternal aunt and mother; Neuropathy in his maternal grandfather; Osteoporosis in his mother; Skin cancer in his maternal grandfather; Thyroid disease in his maternal aunt.  ROS as above:  Medications: Current Outpatient Medications  Medication Sig Dispense Refill  . albuterol (PROAIR HFA) 108 (90 BASE) MCG/ACT inhaler Inhale 2 puffs into the lungs every 6 (six) hours as needed. 8.5 g 1   No current facility-administered medications for this visit.    Allergies  Allergen Reactions  . Penicillins     REACTION: hives, itching    Health Maintenance Health Maintenance  Topic Date Due  . HIV Screening  01/05/2001  . TETANUS/TDAP  09/15/2014  . INFLUENZA VACCINE  03/21/2018 (Originally 12/15/2016)     Exam:  BP 130/72   Pulse 66   Temp 98.6 F (37 C) (Oral)   Wt 219 lb (99.3  kg)   BMI 32.34 kg/m  Gen: Well NAD Skin: Erythematous tender fluctuant mass at the mid thoracic back with some surrounding skin induration. No pus expressible.  Abscess incision and drainage. Consent obtained and timeout performed. Skin cleaned with alcohol, and cold spray applied. 2 mL of lidocaine with epi injected achieving good anesthesia. Skin was again cleaned with alcohol. A sharp incision was made to the area of fluctuance. The incision was widened and pus was expressed.  Blunt dissection was used to break up loculations. Further pus was expressed. Patient tolerated the procedure well. A dressing was applied    No results found for this or any previous visit (from the past 72 hour(s)). No results found.    Assessment and Plan: 31 y.o. male with infected sebaceous cyst is the most likely diagnosis. Treated with drainage. No packing use. Patient will return as needed.   No orders of the defined types were placed in this encounter.  No orders of the defined types were placed in this encounter.    Discussed warning signs or symptoms. Please see discharge instructions. Patient expresses understanding.
